# Patient Record
Sex: Female | Born: 1950 | Race: Black or African American | Hispanic: No | Smoking: Never smoker
Health system: Southern US, Community
[De-identification: ages and names within clinical notes are randomized; demographics above are authoritative.]

## PROBLEM LIST (undated history)

## (undated) DIAGNOSIS — M109 Gout, unspecified: Secondary | ICD-10-CM

## (undated) DIAGNOSIS — I10 Essential (primary) hypertension: Secondary | ICD-10-CM

---

## 2004-04-25 ENCOUNTER — Emergency Department (HOSPITAL_COMMUNITY): Admission: EM | Admit: 2004-04-25 | Discharge: 2004-04-25 | Payer: Self-pay

## 2004-06-02 ENCOUNTER — Emergency Department (HOSPITAL_COMMUNITY): Admission: EM | Admit: 2004-06-02 | Discharge: 2004-06-02 | Payer: Self-pay | Admitting: Family Medicine

## 2007-12-02 ENCOUNTER — Emergency Department (HOSPITAL_COMMUNITY): Admission: EM | Admit: 2007-12-02 | Discharge: 2007-12-02 | Payer: Self-pay | Admitting: Emergency Medicine

## 2007-12-05 ENCOUNTER — Emergency Department (HOSPITAL_COMMUNITY): Admission: EM | Admit: 2007-12-05 | Discharge: 2007-12-05 | Payer: Self-pay | Admitting: Emergency Medicine

## 2009-12-15 ENCOUNTER — Emergency Department (HOSPITAL_COMMUNITY): Admission: EM | Admit: 2009-12-15 | Discharge: 2009-12-15 | Payer: Self-pay | Admitting: Family Medicine

## 2009-12-27 ENCOUNTER — Emergency Department (HOSPITAL_COMMUNITY): Admission: EM | Admit: 2009-12-27 | Discharge: 2009-12-27 | Payer: Self-pay | Admitting: Emergency Medicine

## 2010-05-03 ENCOUNTER — Emergency Department (HOSPITAL_COMMUNITY): Admission: EM | Admit: 2010-05-03 | Discharge: 2010-05-03 | Payer: Self-pay | Admitting: Emergency Medicine

## 2010-09-28 LAB — POCT I-STAT, CHEM 8
BUN: 14 mg/dL (ref 6–23)
Chloride: 105 mEq/L (ref 96–112)
Creatinine, Ser: 1 mg/dL (ref 0.4–1.2)
Potassium: 4.5 mEq/L (ref 3.5–5.1)
Sodium: 139 mEq/L (ref 135–145)

## 2015-11-04 ENCOUNTER — Emergency Department (HOSPITAL_COMMUNITY): Payer: Medicare Other

## 2015-11-04 ENCOUNTER — Emergency Department (HOSPITAL_COMMUNITY)
Admission: EM | Admit: 2015-11-04 | Discharge: 2015-11-04 | Disposition: A | Discharge status: Home / Self Care | Payer: Medicare Other | Attending: Emergency Medicine | Admitting: Emergency Medicine

## 2015-11-04 ENCOUNTER — Encounter (HOSPITAL_COMMUNITY): Payer: Self-pay

## 2015-11-04 DIAGNOSIS — M109 Gout, unspecified: Secondary | ICD-10-CM | POA: Diagnosis present

## 2015-11-04 DIAGNOSIS — Z79899 Other long term (current) drug therapy: Secondary | ICD-10-CM | POA: Insufficient documentation

## 2015-11-04 DIAGNOSIS — I10 Essential (primary) hypertension: Secondary | ICD-10-CM | POA: Diagnosis not present

## 2015-11-04 DIAGNOSIS — M10079 Idiopathic gout, unspecified ankle and foot: Secondary | ICD-10-CM | POA: Diagnosis not present

## 2015-11-04 HISTORY — DX: Essential (primary) hypertension: I10

## 2015-11-04 HISTORY — DX: Gout, unspecified: M10.9

## 2015-11-04 MED ORDER — IBUPROFEN 400 MG PO TABS
800.0000 mg | ORAL_TABLET | Freq: Once | ORAL | Status: AC
  Administered 2015-11-04: 800 mg via ORAL
  Filled 2015-11-04: qty 2

## 2015-11-04 MED ORDER — IBUPROFEN 600 MG PO TABS: 600.0000 mg | ORAL_TABLET | Freq: Four times a day (QID) | ORAL | Status: AC | PRN

## 2015-11-04 NOTE — ED Notes (Signed)
Pt here for a gout flare up to right foot, onset Sunday after wearing tight shoes.

## 2015-11-04 NOTE — ED Notes (Signed)
Pt st's she thinks she has gout in her right foot.  St's pain is in her great toe.  Painful to walk.

## 2015-11-04 NOTE — ED Provider Notes (Signed)
CSN: [id number]     Arrival date & time 11/04/15  2038 History  By signing my name below, I, Doreatha Martin, attest that this documentation has been prepared under the direction and in the presence of  Melton Krebs, PA-C. Electronically Signed: Doreatha Martin, ED Scribe. 11/04/2015. 9:37 PM.    Chief Complaint  Patient presents with  . Gout   The history is provided by the patient. No language interpreter was used.   HPI Comments: Tracey Mcguire is a 65 y.o. female who presents to the Emergency Department complaining of moderate bilateral great toe pain (R>L) onset 4 days ago. Pt denies recent trauma, injury, falls.  Per pt, her symptoms began after wearing new shoes. She also reports consuming sugary drinks and hot sauce. Pt states her pain is exacerbated with weight bearing and ambulation. She is able to flex and extend the toes and ankles. Denies fever, chills, nausea, emesis.  No known h/o gout.   Past Medical History  Diagnosis Date  . Gout     unsure if she has it  . Hypertension    History reviewed. No pertinent past surgical history. No family history on file. Social History  Substance Use Topics  . Smoking status: Never Smoker   . Smokeless tobacco: None  . Alcohol Use: No   OB History    No data available     Review of Systems A complete 10 system review of systems was obtained and all systems are negative except as noted in the HPI and PMH.    Allergies  Review of patient's allergies indicates no known allergies.  Home Medications   Prior to Admission medications   Medication Sig Start Date End Date Taking? Authorizing Provider  gabapentin (NEURONTIN) 300 MG capsule Take 300 mg by mouth at bedtime.   Yes Historical Provider, MD  Omega-3 Fatty Acids (OMEGA 3 PO) Take 1 capsule by mouth 2 (two) times daily.   Yes Historical Provider, MD  traMADol (ULTRAM) 50 MG tablet Take 50 mg by mouth every 6 (six) hours as needed for moderate pain.   Yes Historical  Provider, MD  valsartan-hydrochlorothiazide (DIOVAN-HCT) 320-12.5 MG tablet Take 1 tablet by mouth daily.   Yes Historical Provider, MD  ibuprofen (ADVIL,MOTRIN) 600 MG tablet Take 1 tablet (600 mg total) by mouth every 6 (six) hours as needed. 11/04/15   Melton Krebs, PA-C   BP 136/75 mmHg  Pulse 94  Temp(Src) 98.2 F (36.8 C) (Oral)  Resp 20  Ht  (1.651 m)  Wt 362 lb (164.202 kg)  BMI 60.24 kg/m2  SpO2 97% Physical Exam  Constitutional: She is oriented to person, place, and time. She appears well-developed and well-nourished.  HENT:  Head: Normocephalic and atraumatic.  Eyes: Conjunctivae are normal.  Cardiovascular: Normal rate.   Pulmonary/Chest: Effort normal. No respiratory distress.  Abdominal: She exhibits no distension.  Musculoskeletal: Normal range of motion. She exhibits edema and tenderness.  BL swelling, erythema, tenderness over medial great toes.  Neurological: She is alert and oriented to person, place, and time.  Strength and sensation equal and intact bilaterally throughout the upper and lower extremities.Normal gait. Coordination intact.  NVI bilaterally.   Skin: Skin is warm and dry.  Psychiatric: She has a normal mood and affect. Her behavior is normal.  Nursing note and vitals reviewed.   ED Course  Procedures (including critical care time) DIAGNOSTIC STUDIES: Oxygen Saturation is 97% on RA, normal by my interpretation.    COORDINATION OF  CARE: 9:29 PM Discussed treatment plan with pt at bedside which includes XR and pt agreed to plan.   Imaging Review Dg Toe Great Left  11/04/2015  CLINICAL DATA:  Bilateral great toe pain. EXAM: LEFT GREAT TOE, three views COMPARISON:  None. FINDINGS: Mild spurring laterally at the first MTP joint. Small medial erosions along the head of the first metatarsal. Spurring along the lateral sesamoid of the great toe. No fracture or acute bony findings. There does appear to be some dorsal soft tissue swelling  along the first MTP joint. IMPRESSION: 1. Dorsal soft tissue swelling along the first MTP joint, with some small medial erosions along the head of the first metatarsal, and degenerative findings laterally in the first MTP joint. The erosions could indicate gout. 2. Mild spurring of the lateral sesamoid of the first digit, likely degenerative. Electronically Signed   By: Gaylyn RongWalter  Liebkemann M.D.   On: 11/04/2015 22:33   Dg Toe Great Right  11/04/2015  CLINICAL DATA:  Bilateral great toe pain. EXAM: RIGHT GREAT TOE three views COMPARISON:  None. FINDINGS: Soft tissue swelling medial to the first MTP joint observed. Erosion or degenerative subcortical cyst formation along the lateral margin of the first metatarsal head. IMPRESSION: 1. Soft tissue swelling and mild marginal erosion of the first metatarsal head, possibly from gout. Electronically Signed   By: Gaylyn RongWalter  Liebkemann M.D.   On: 11/04/2015 22:33   I have personally reviewed and evaluated these images as part of my medical decision-making.   MDM   Final diagnoses:  Acute gout of foot, unspecified cause, unspecified laterality    Pt presents with articular pain, swelling and erythema.  Pt is afebrile and stable. Imaging reviewed, no evidence of occult fracture or injury. Pt dc with ibuprofen for pain as needed. Return precautions outlined and discussed in discharge paperwork. Pt is agreeable to plan.     I personally performed the services described in this documentation, which was scribed in my presence. The recorded information has been reviewed and is accurate.   Melton KrebsSamantha Nicole Hagen Bohorquez, PA-C 11/10/15 [id number]  Geoffery Lyonsouglas Delo, MD 11/10/15 [phone]

## 2015-11-04 NOTE — Discharge Instructions (Signed)
Ms. Opal SidlesBarbara White-Tate,  Nice meeting you! Please follow-up with your primary care provider. Return to the emergency department if you develop fevers, chills, increased pain/swelling, new/worsening symptoms. Feel better soon!  S. Lane HackerNicole Izekiel Flegel, PA-C Gout Gout is an inflammatory arthritis caused by a buildup of uric acid crystals in the joints. Uric acid is a chemical that is normally present in the blood. When the level of uric acid in the blood is too high it can form crystals that deposit in your joints and tissues. This causes joint redness, soreness, and swelling (inflammation). Repeat attacks are common. Over time, uric acid crystals can form into masses (tophi) near a joint, destroying bone and causing disfigurement. Gout is treatable and often preventable. CAUSES  The disease begins with elevated levels of uric acid in the blood. Uric acid is produced by your body when it breaks down a naturally found substance called purines. Certain foods you eat, such as meats and fish, contain high amounts of purines. Causes of an elevated uric acid level include:  Being passed down from parent to child (heredity).  Diseases that cause increased uric acid production (such as obesity, psoriasis, and certain cancers).  Excessive alcohol use.  Diet, especially diets rich in meat and seafood.  Medicines, including certain cancer-fighting medicines (chemotherapy), water pills (diuretics), and aspirin.  Chronic kidney disease. The kidneys are no longer able to remove uric acid well.  Problems with metabolism. Conditions strongly associated with gout include:  Obesity.  High blood pressure.  High cholesterol.  Diabetes. Not everyone with elevated uric acid levels gets gout. It is not understood why some people get gout and others do not. Surgery, joint injury, and eating too much of certain foods are some of the factors that can lead to gout attacks. SYMPTOMS   An attack of gout comes on  quickly. It causes intense pain with redness, swelling, and warmth in a joint.  Fever can occur.  Often, only one joint is involved. Certain joints are more commonly involved:  Base of the big toe.  Knee.  Ankle.  Wrist.  Finger. Without treatment, an attack usually goes away in a few days to weeks. Between attacks, you usually will not have symptoms, which is different from many other forms of arthritis. DIAGNOSIS  Your caregiver will suspect gout based on your symptoms and exam. In some cases, tests may be recommended. The tests may include:  Blood tests.  Urine tests.  X-rays.  Joint fluid exam. This exam requires a needle to remove fluid from the joint (arthrocentesis). Using a microscope, gout is confirmed when uric acid crystals are seen in the joint fluid. TREATMENT  There are two phases to gout treatment: treating the sudden onset (acute) attack and preventing attacks (prophylaxis).  Treatment of an Acute Attack.  Medicines are used. These include anti-inflammatory medicines or steroid medicines.  An injection of steroid medicine into the affected joint is sometimes necessary.  The painful joint is rested. Movement can worsen the arthritis.  You may use warm or cold treatments on painful joints, depending which works best for you.  Treatment to Prevent Attacks.  If you suffer from frequent gout attacks, your caregiver may advise preventive medicine. These medicines are started after the acute attack subsides. These medicines either help your kidneys eliminate uric acid from your body or decrease your uric acid production. You may need to stay on these medicines for a very long time.  The early phase of treatment with preventive medicine can be  associated with an increase in acute gout attacks. For this reason, during the first few months of treatment, your caregiver may also advise you to take medicines usually used for acute gout treatment. Be sure you understand  your caregiver's directions. Your caregiver may make several adjustments to your medicine dose before these medicines are effective.  Discuss dietary treatment with your caregiver or dietitian. Alcohol and drinks high in sugar and fructose and foods such as meat, poultry, and seafood can increase uric acid levels. Your caregiver or dietitian can advise you on drinks and foods that should be limited. HOME CARE INSTRUCTIONS   Do not take aspirin to relieve pain. This raises uric acid levels.  Only take over-the-counter or prescription medicines for pain, discomfort, or fever as directed by your caregiver.  Rest the joint as much as possible. When in bed, keep sheets and blankets off painful areas.  Keep the affected joint raised (elevated).  Apply warm or cold treatments to painful joints. Use of warm or cold treatments depends on which works best for you.  Use crutches if the painful joint is in your leg.  Drink enough fluids to keep your urine clear or pale yellow. This helps your body get rid of uric acid. Limit alcohol, sugary drinks, and fructose drinks.  Follow your dietary instructions. Pay careful attention to the amount of protein you eat. Your daily diet should emphasize fruits, vegetables, whole grains, and fat-free or low-fat milk products. Discuss the use of coffee, vitamin C, and cherries with your caregiver or dietitian. These may be helpful in lowering uric acid levels.  Maintain a healthy body weight. SEEK MEDICAL CARE IF:   You develop diarrhea, vomiting, or any side effects from medicines.  You do not feel better in 24 hours, or you are getting worse. SEEK IMMEDIATE MEDICAL CARE IF:   Your joint becomes suddenly more tender, and you have chills or a fever. MAKE SURE YOU:   Understand these instructions.  Will watch your condition.  Will get help right away if you are not doing well or get worse.   This information is not intended to replace advice given to you  by your health care provider. Make sure you discuss any questions you have with your health care provider.   Document Released: 06/30/2000 Document Revised: 07/24/2014 Document Reviewed: 02/14/2012 Elsevier Interactive Patient Education 2016 [employer]  Low-Purine Diet Purines are compounds that affect the level of uric acid in your body. A low-purine diet is a diet that is low in purines. Eating a low-purine diet can prevent the level of uric acid in your body from getting too high and causing gout or kidney stones or both. WHAT DO I NEED TO KNOW ABOUT THIS DIET?  Choose low-purine foods. Examples of low-purine foods are listed in the next section.  Drink plenty of fluids, especially water. Fluids can help remove uric acid from your body. Try to drink 8-16 cups (1.9-3.8 L) a day.  Limit foods high in fat, especially saturated fat, as fat makes it harder for the body to get rid of uric acid. Foods high in saturated fat include pizza, cheese, ice cream, whole milk, fried foods, and gravies. Choose foods that are lower in fat and lean sources of protein. Use olive oil when cooking as it contains healthy fats that are not high in saturated fat.  Limit alcohol. Alcohol interferes with the elimination of uric acid from your body. If you are having a gout attack, avoid all  alcohol.  Keep in mind that different people's bodies react differently to different foods. You will probably learn over time which foods do or do not affect you. If you discover that a food tends to cause your gout to flare up, avoid eating that food. You can more freely enjoy foods that do not cause problems. If you have any questions about a food item, talk to your dietitian or health care provider. WHICH FOODS ARE LOW, MODERATE, AND HIGH IN PURINES? The following is a list of foods that are low, moderate, and high in purines. You can eat any amount of the foods that are low in purines. You may be able to have small amounts of  foods that are moderate in purines. Ask your health care provider how much of a food moderate in purines you can have. Avoid foods high in purines. Grains  Foods low in purines: Enriched white bread, pasta, rice, cake, cornbread, popcorn.  Foods moderate in purines: Whole-grain breads and cereals, wheat germ, bran, oatmeal. Uncooked oatmeal. Dry wheat bran or wheat germ.  Foods high in purines: Pancakes, [place] toast, biscuits, muffins. Vegetables  Foods low in purines: All vegetables, except those that are moderate in purines.  Foods moderate in purines: Asparagus, cauliflower, spinach, mushrooms, green peas. Fruits  All fruits are low in purines. Meats and other Protein Foods  Foods low in purines: Eggs, nuts, peanut butter.  Foods moderate in purines: 80-90% lean beef, lamb, veal, pork, poultry, fish, eggs, peanut butter, nuts. Crab, lobster, oysters, and shrimp. Cooked dried beans, peas, and lentils.  Foods high in purines: Anchovies, sardines, herring, mussels, tuna, codfish, scallops, trout, and haddock. Tomasa Blase. Organ meats (such as liver or kidney). Tripe. Game meat. Goose. Sweetbreads. Dairy  All dairy foods are low in purines. Low-fat and fat-free dairy products are best because they are low in saturated fat. Beverages  Drinks low in purines: Water, carbonated beverages, tea, coffee, cocoa.  Drinks moderate in purines: Soft drinks and other drinks sweetened with high-fructose corn syrup. Juices. To find whether a food or drink is sweetened with high-fructose corn syrup, look at the ingredients list.  Drinks high in purines: Alcoholic beverages (such as beer). Condiments  Foods low in purines: Salt, herbs, olives, pickles, relishes, vinegar.  Foods moderate in purines: Butter, margarine, oils, mayonnaise. Fats and Oils  Foods low in purines: All types, except gravies and sauces made with meat.  Foods high in purines: Gravies and sauces made with meat. Other  Foods  Foods low in purines: Sugars, sweets, gelatin. Cake. Soups made without meat.  Foods moderate in purines: Meat-based or fish-based soups, broths, or bouillons. Foods and drinks sweetened with high-fructose corn syrup.  Foods high in purines: High-fat desserts (such as ice cream, cookies, cakes, pies, doughnuts, and chocolate). Contact your dietitian for more information on foods that are not listed here.   This information is not intended to replace advice given to you by your health care provider. Make sure you discuss any questions you have with your health care provider.   Document Released: 10/28/2010 Document Revised: 07/08/2013 Document Reviewed: 06/09/2013 Elsevier Interactive Patient Education [employer]

## 2017-05-28 IMAGING — DX DG TOE GREAT 2+V*R*
3 series · 3 of 3 positions shown · non-contrast
Comparison: None.

CLINICAL DATA: Bilateral great toe pain.

EXAM:
RIGHT GREAT TOE three views

[toe ap]
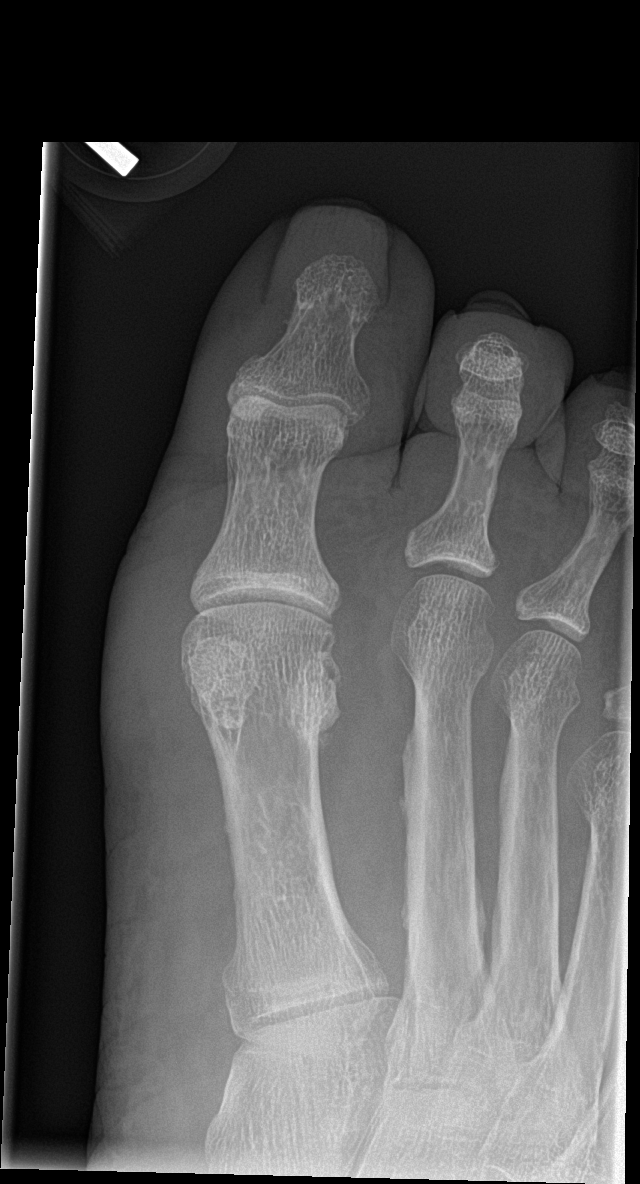

[toe obl]
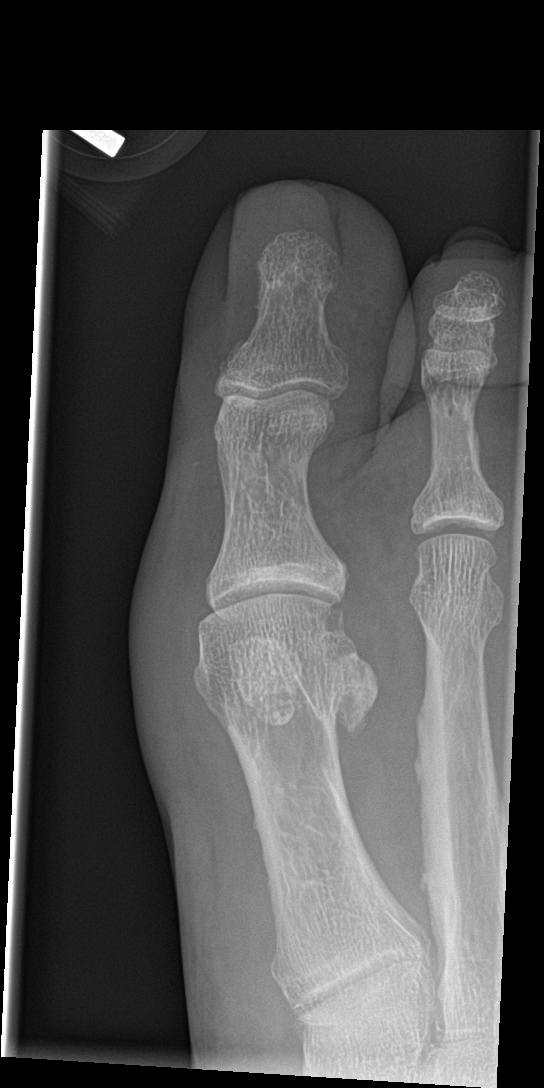

[toe lat]
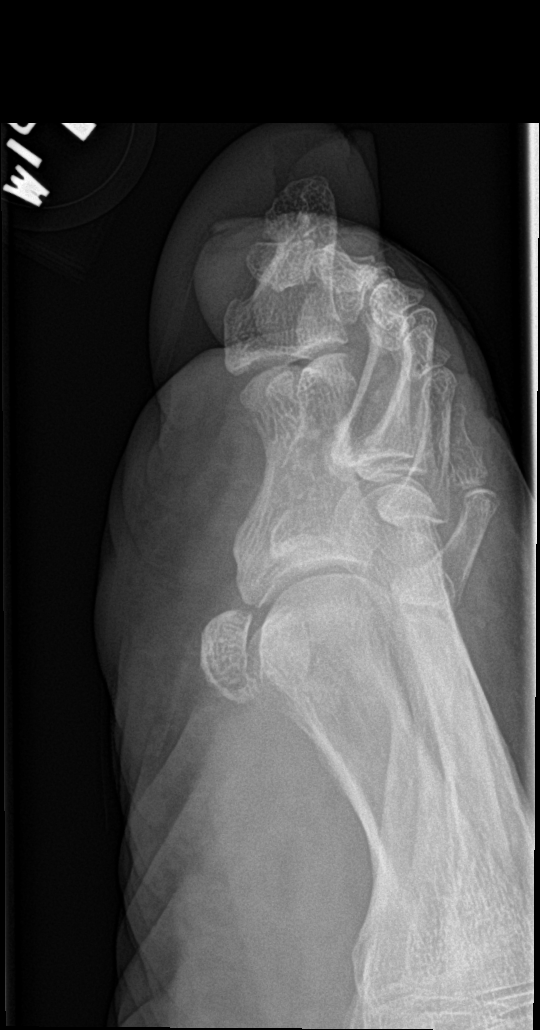

[3 of 3 positions shown; findings below may reference images not displayed]

FINDINGS: Soft tissue swelling medial to the first MTP joint observed. Erosion
or degenerative subcortical cyst formation along the lateral margin
of the first metatarsal head.
IMPRESSION: 1. Soft tissue swelling and mild marginal erosion of the first
metatarsal head, possibly from gout.

## 2017-05-28 IMAGING — DX DG TOE GREAT 2+V*L*
3 series · 3 of 3 positions shown · non-contrast
Comparison: None.

CLINICAL DATA: Bilateral great toe pain.

EXAM:
LEFT GREAT TOE, three views

[toe ap]
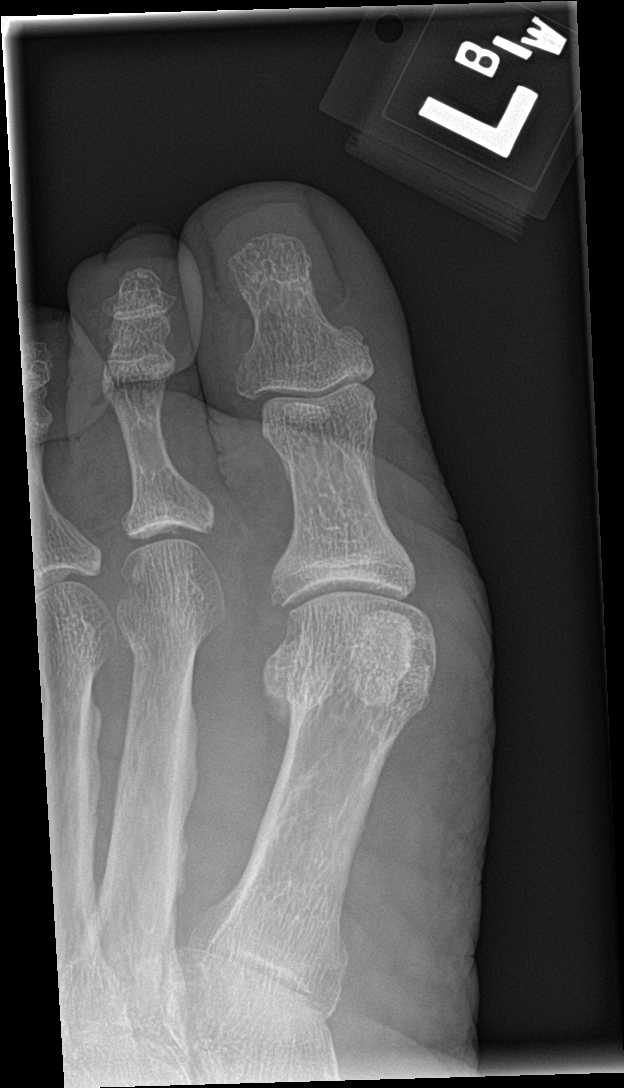

[toe obl]
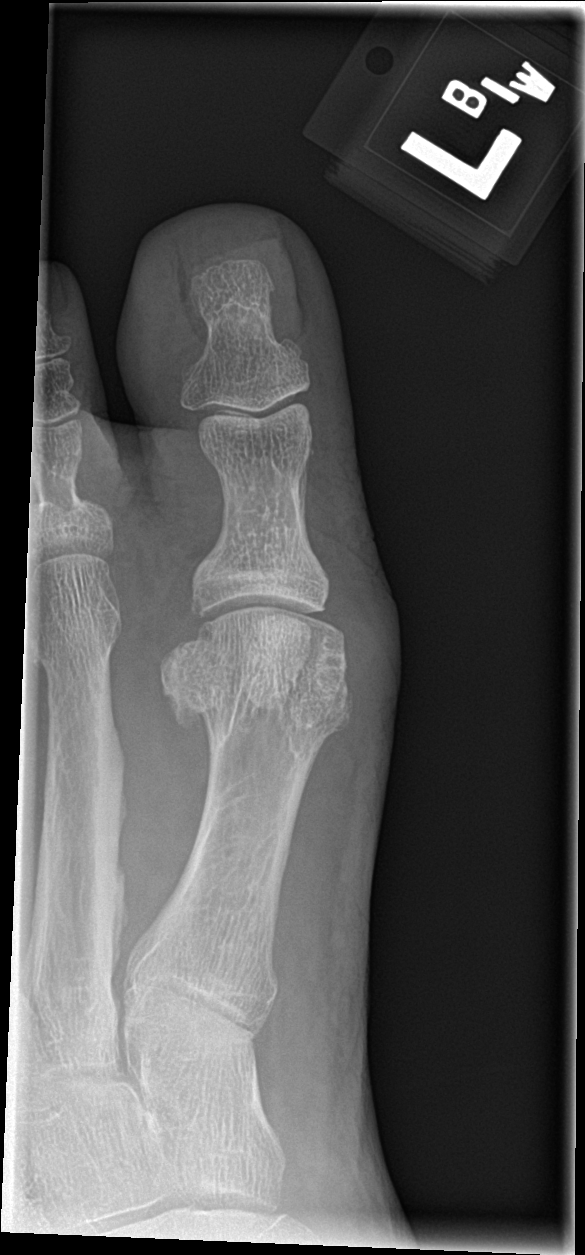

[toe lat]
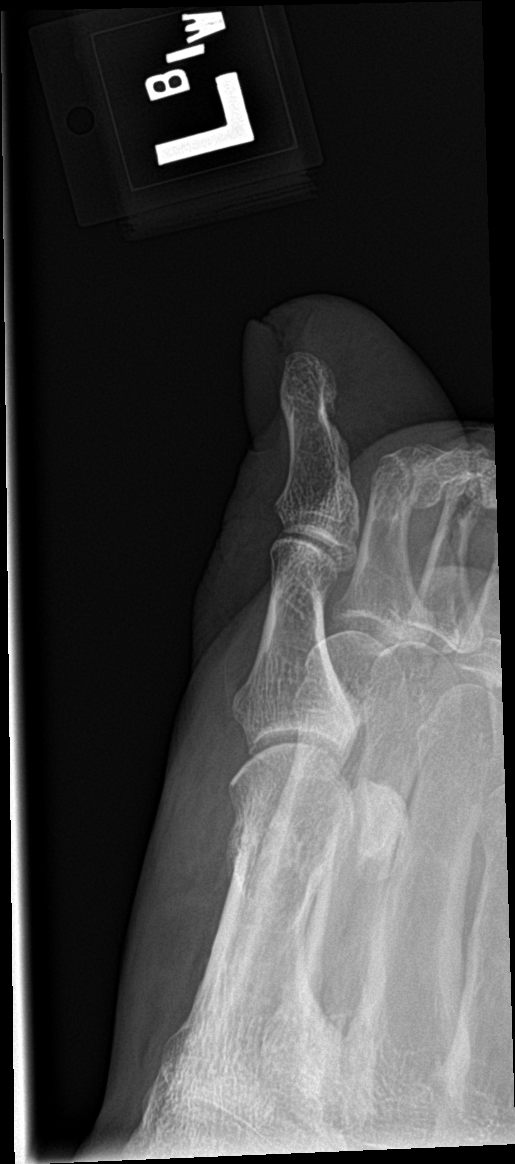

[3 of 3 positions shown; findings below may reference images not displayed]

FINDINGS: Mild spurring laterally at the first MTP joint. Small medial
erosions along the head of the first metatarsal. Spurring along the
lateral sesamoid of the great toe.

No fracture or acute bony findings. There does appear to be some
dorsal soft tissue swelling along the first MTP joint.
IMPRESSION: 1. Dorsal soft tissue swelling along the first MTP joint, with some
small medial erosions along the head of the first metatarsal, and
degenerative findings laterally in the first MTP joint. The erosions
could indicate gout.
2. Mild spurring of the lateral sesamoid of the first digit, likely
degenerative.

## 2020-09-23 ENCOUNTER — Other Ambulatory Visit (HOSPITAL_COMMUNITY): Payer: Self-pay | Admitting: Internal Medicine

## 2020-09-23 DIAGNOSIS — I739 Peripheral vascular disease, unspecified: Secondary | ICD-10-CM

## 2020-09-24 ENCOUNTER — Ambulatory Visit (HOSPITAL_COMMUNITY)
Admission: RE | Admit: 2020-09-24 | Discharge: 2020-09-24 | Disposition: A | Discharge status: Home / Self Care | Payer: Medicare Other | Source: Ambulatory Visit | Attending: Internal Medicine | Admitting: Internal Medicine

## 2020-09-24 ENCOUNTER — Other Ambulatory Visit: Payer: Self-pay

## 2020-09-24 DIAGNOSIS — I739 Peripheral vascular disease, unspecified: Secondary | ICD-10-CM | POA: Diagnosis present

## 2022-09-04 DIAGNOSIS — N1831 Chronic kidney disease, stage 3a: Secondary | ICD-10-CM | POA: Diagnosis not present

## 2022-09-04 DIAGNOSIS — E1169 Type 2 diabetes mellitus with other specified complication: Secondary | ICD-10-CM | POA: Diagnosis not present

## 2022-09-04 DIAGNOSIS — G720 Drug-induced myopathy: Secondary | ICD-10-CM | POA: Diagnosis not present

## 2022-09-04 DIAGNOSIS — E785 Hyperlipidemia, unspecified: Secondary | ICD-10-CM | POA: Diagnosis not present

## 2022-09-04 DIAGNOSIS — I129 Hypertensive chronic kidney disease with stage 1 through stage 4 chronic kidney disease, or unspecified chronic kidney disease: Secondary | ICD-10-CM | POA: Diagnosis not present

## 2023-01-15 DIAGNOSIS — H2513 Age-related nuclear cataract, bilateral: Secondary | ICD-10-CM | POA: Diagnosis not present

## 2023-01-15 DIAGNOSIS — H25013 Cortical age-related cataract, bilateral: Secondary | ICD-10-CM | POA: Diagnosis not present

## 2023-01-15 DIAGNOSIS — H5213 Myopia, bilateral: Secondary | ICD-10-CM | POA: Diagnosis not present

## 2023-01-15 DIAGNOSIS — H524 Presbyopia: Secondary | ICD-10-CM | POA: Diagnosis not present

## 2023-01-15 DIAGNOSIS — H40003 Preglaucoma, unspecified, bilateral: Secondary | ICD-10-CM | POA: Diagnosis not present

## 2023-03-07 DIAGNOSIS — E1169 Type 2 diabetes mellitus with other specified complication: Secondary | ICD-10-CM | POA: Diagnosis not present

## 2023-03-07 DIAGNOSIS — I129 Hypertensive chronic kidney disease with stage 1 through stage 4 chronic kidney disease, or unspecified chronic kidney disease: Secondary | ICD-10-CM | POA: Diagnosis not present

## 2023-03-07 DIAGNOSIS — M109 Gout, unspecified: Secondary | ICD-10-CM | POA: Diagnosis not present

## 2023-03-07 DIAGNOSIS — E785 Hyperlipidemia, unspecified: Secondary | ICD-10-CM | POA: Diagnosis not present

## 2023-03-07 DIAGNOSIS — N1831 Chronic kidney disease, stage 3a: Secondary | ICD-10-CM | POA: Diagnosis not present

## 2023-03-14 DIAGNOSIS — Z1339 Encounter for screening examination for other mental health and behavioral disorders: Secondary | ICD-10-CM | POA: Diagnosis not present

## 2023-03-14 DIAGNOSIS — R82998 Other abnormal findings in urine: Secondary | ICD-10-CM | POA: Diagnosis not present

## 2023-03-14 DIAGNOSIS — I129 Hypertensive chronic kidney disease with stage 1 through stage 4 chronic kidney disease, or unspecified chronic kidney disease: Secondary | ICD-10-CM | POA: Diagnosis not present

## 2023-03-14 DIAGNOSIS — Z23 Encounter for immunization: Secondary | ICD-10-CM | POA: Diagnosis not present

## 2023-03-14 DIAGNOSIS — E785 Hyperlipidemia, unspecified: Secondary | ICD-10-CM | POA: Diagnosis not present

## 2023-03-14 DIAGNOSIS — G720 Drug-induced myopathy: Secondary | ICD-10-CM | POA: Diagnosis not present

## 2023-03-14 DIAGNOSIS — Z0000 Encounter for general adult medical examination without abnormal findings: Secondary | ICD-10-CM | POA: Diagnosis not present

## 2023-03-14 DIAGNOSIS — E1169 Type 2 diabetes mellitus with other specified complication: Secondary | ICD-10-CM | POA: Diagnosis not present

## 2023-03-14 DIAGNOSIS — Z1331 Encounter for screening for depression: Secondary | ICD-10-CM | POA: Diagnosis not present

## 2023-03-14 DIAGNOSIS — N1831 Chronic kidney disease, stage 3a: Secondary | ICD-10-CM | POA: Diagnosis not present
